# Patient Record
Sex: Male | Born: 1945 | Race: Black or African American | Hispanic: No | Smoking: Never smoker
Health system: Southern US, Community
[De-identification: ages and names within clinical notes are randomized; demographics above are authoritative.]

## PROBLEM LIST (undated history)

## (undated) DIAGNOSIS — C801 Malignant (primary) neoplasm, unspecified: Secondary | ICD-10-CM

## (undated) DIAGNOSIS — G8929 Other chronic pain: Secondary | ICD-10-CM

## (undated) DIAGNOSIS — K219 Gastro-esophageal reflux disease without esophagitis: Secondary | ICD-10-CM

## (undated) DIAGNOSIS — I639 Cerebral infarction, unspecified: Secondary | ICD-10-CM

## (undated) DIAGNOSIS — I10 Essential (primary) hypertension: Secondary | ICD-10-CM

## (undated) DIAGNOSIS — J449 Chronic obstructive pulmonary disease, unspecified: Secondary | ICD-10-CM

## (undated) DIAGNOSIS — I219 Acute myocardial infarction, unspecified: Secondary | ICD-10-CM

## (undated) DIAGNOSIS — M549 Dorsalgia, unspecified: Secondary | ICD-10-CM

## (undated) DIAGNOSIS — M542 Cervicalgia: Secondary | ICD-10-CM

## (undated) HISTORY — PX: PROSTATE CRYOABLATION: SUR358

## (undated) HISTORY — PX: KNEE SURGERY: SHX244

---

## 2004-03-31 ENCOUNTER — Inpatient Hospital Stay (HOSPITAL_COMMUNITY): Admission: RE | Admit: 2004-03-31 | Discharge: 2004-04-03 | Payer: Self-pay | Admitting: Neurosurgery

## 2005-10-05 ENCOUNTER — Encounter: Payer: Self-pay | Admitting: Neurosurgery

## 2005-10-05 ENCOUNTER — Ambulatory Visit (HOSPITAL_COMMUNITY): Admission: RE | Admit: 2005-10-05 | Discharge: 2005-10-05 | Payer: Self-pay | Admitting: Neurosurgery

## 2016-04-03 ENCOUNTER — Emergency Department (HOSPITAL_BASED_OUTPATIENT_CLINIC_OR_DEPARTMENT_OTHER)
Admission: EM | Admit: 2016-04-03 | Discharge: 2016-04-04 | Disposition: A | Discharge status: Home / Self Care | Payer: Medicare Other | Attending: Emergency Medicine | Admitting: Emergency Medicine

## 2016-04-03 ENCOUNTER — Encounter (HOSPITAL_BASED_OUTPATIENT_CLINIC_OR_DEPARTMENT_OTHER): Payer: Self-pay | Admitting: Emergency Medicine

## 2016-04-03 DIAGNOSIS — Z79899 Other long term (current) drug therapy: Secondary | ICD-10-CM | POA: Insufficient documentation

## 2016-04-03 DIAGNOSIS — Z7901 Long term (current) use of anticoagulants: Secondary | ICD-10-CM | POA: Insufficient documentation

## 2016-04-03 DIAGNOSIS — Z8546 Personal history of malignant neoplasm of prostate: Secondary | ICD-10-CM | POA: Diagnosis not present

## 2016-04-03 DIAGNOSIS — J449 Chronic obstructive pulmonary disease, unspecified: Secondary | ICD-10-CM | POA: Diagnosis not present

## 2016-04-03 DIAGNOSIS — Z7982 Long term (current) use of aspirin: Secondary | ICD-10-CM | POA: Insufficient documentation

## 2016-04-03 DIAGNOSIS — H5712 Ocular pain, left eye: Secondary | ICD-10-CM

## 2016-04-03 DIAGNOSIS — H1132 Conjunctival hemorrhage, left eye: Secondary | ICD-10-CM | POA: Insufficient documentation

## 2016-04-03 DIAGNOSIS — I10 Essential (primary) hypertension: Secondary | ICD-10-CM | POA: Insufficient documentation

## 2016-04-03 HISTORY — DX: Cerebral infarction, unspecified: I63.9

## 2016-04-03 HISTORY — DX: Chronic obstructive pulmonary disease, unspecified: J44.9

## 2016-04-03 HISTORY — DX: Other chronic pain: G89.29

## 2016-04-03 HISTORY — DX: Gastro-esophageal reflux disease without esophagitis: K21.9

## 2016-04-03 HISTORY — DX: Cervicalgia: M54.2

## 2016-04-03 HISTORY — DX: Dorsalgia, unspecified: M54.9

## 2016-04-03 HISTORY — DX: Acute myocardial infarction, unspecified: I21.9

## 2016-04-03 HISTORY — DX: Essential (primary) hypertension: I10

## 2016-04-03 HISTORY — DX: Malignant (primary) neoplasm, unspecified: C80.1

## 2016-04-03 MED ORDER — FLUORESCEIN SODIUM 1 MG OP STRP
ORAL_STRIP | OPHTHALMIC | Status: AC
  Filled 2016-04-03: qty 1

## 2016-04-03 MED ORDER — TETRACAINE HCL 0.5 % OP SOLN
2.0000 [drp] | Freq: Once | OPHTHALMIC | Status: AC
  Administered 2016-04-03: 2 [drp] via OPHTHALMIC

## 2016-04-03 MED ORDER — FLUORESCEIN SODIUM 1 MG OP STRP
1.0000 | ORAL_STRIP | Freq: Once | OPHTHALMIC | Status: AC
  Administered 2016-04-03: 1 via OPHTHALMIC

## 2016-04-03 MED ORDER — TETRACAINE HCL 0.5 % OP SOLN
OPHTHALMIC | Status: AC
  Filled 2016-04-03: qty 4

## 2016-04-03 NOTE — ED Triage Notes (Signed)
Patient states that he has a sharp pain to his left eye and then has blood in his scelara. The patient has noted blood to his conjunctiva

## 2016-04-03 NOTE — ED Provider Notes (Signed)
[place] DEPT Provider Note   CSN: [id number] Arrival date & time: 04/03/16  1957  By signing my name below, I, Dora Sims, attest that this documentation has been prepared under the direction and in the presence of non-physician practitioner, Eliezer Mccoy, PA-C. Electronically Signed: Dora Sims, Scribe. 04/03/2016. 10:55 PM.  History   Chief Complaint Chief Complaint  Patient presents with  . Eye Problem    The history is provided by the patient. No language interpreter was used.     HPI Comments: Barry Griffin is a 70 y.o. male who presents to the Emergency Department complaining of a left eye problem that began shortly PTA. He states he was out shopping and suddenly felt something pop behind his left eye. He reports he had severe, throbbing pain at this time for about 15 minutes which has since eased off; he rates his current pain as 3/10. He reports significant dark, blurry vision out of his left eye as well and states this has gradually improved. He states that after his pain eased off, he noticed severe redness of his left eye. He notes he did not sneeze or cough before his left eye pain presented. Pt is diagnosed with vertigo and notes he has experienced some significant dizziness over the last few weeks. He wears bifocals at baseline. He denies headache, fever, chills, CP, SOB, abdominal pain, nausea, vomiting, or any other associated symptoms.  Past Medical History:  Diagnosis Date  . Cancer [facility])    prostate  . Chronic back pain   . Chronic neck pain   . COPD (chronic obstructive pulmonary disease) ([place])   . GERD (gastroesophageal reflux disease)   . Hypertension   . MI (myocardial infarction)   . Stroke [facility])     There are no active problems to display for this patient.   Past Surgical History:  Procedure Laterality Date  . KNEE SURGERY    . PROSTATE CRYOABLATION         Home Medications    Prior to Admission medications   Medication Sig  Start Date End Date Taking? Authorizing Provider  albuterol (PROVENTIL) 2 MG tablet Take 2 mg by mouth 3 (three) times daily.   Yes Historical Provider, MD  allopurinol (ZYLOPRIM) 100 MG tablet Take 100 mg by mouth daily.   Yes Historical Provider, MD  aspirin EC 81 MG tablet Take 81 mg by mouth daily.   Yes Historical Provider, MD  cloNIDine HCl (KAPVAY) 0.1 MG TB12 ER tablet Take by mouth.   Yes Historical Provider, MD  fluticasone-salmeterol (ADVAIR HFA) 230-21 MCG/ACT inhaler Inhale 2 puffs into the lungs 2 (two) times daily.   Yes Historical Provider, MD  furosemide (LASIX) 40 MG tablet Take 40 mg by mouth.   Yes Historical Provider, MD  gabapentin (NEURONTIN) 300 MG capsule Take 300 mg by mouth 3 (three) times daily.   Yes Historical Provider, MD  meclizine (ANTIVERT) 25 MG tablet Take 25 mg by mouth 3 (three) times daily as needed for dizziness.   Yes Historical Provider, MD  metoprolol (LOPRESSOR) 100 MG tablet Take 100 mg by mouth 2 (two) times daily.   Yes Historical Provider, MD  omeprazole (PRILOSEC) 20 MG capsule Take 20 mg by mouth daily.   Yes Historical Provider, MD  traMADol (ULTRAM) 50 MG tablet Take by mouth every 6 (six) hours as needed.   Yes Historical Provider, MD  warfarin (COUMADIN) 4 MG tablet Take 5 mg by mouth daily.   Yes Historical Provider, MD  Family History History reviewed. No pertinent family history.  Social History Social History  Substance Use Topics  . Smoking status: Never Smoker  . Smokeless tobacco: Never Used  . Alcohol use No     Allergies   Lisinopril   Review of Systems Review of Systems  Constitutional: Negative for chills and fever.  Eyes: Positive for pain (left), redness (left) and visual disturbance (dark, blurry vision left eye).  Respiratory: Negative for shortness of breath.   Cardiovascular: Negative for chest pain.  Gastrointestinal: Negative for abdominal pain, nausea and vomiting.  Musculoskeletal: Negative for back  pain.  Skin: Negative for rash and wound.  Neurological: Positive for dizziness (over last several weeks). Negative for headaches.  Psychiatric/Behavioral: The patient is not nervous/anxious.      Physical Exam Updated Vital Signs BP 155/75 (BP Location: Left Arm)   Pulse 61   Temp 98.1 F (36.7 C) (Oral)   Resp 18   Ht 6\' 2"  (1.88 m)   Wt 124.7 kg   SpO2 100%   BMI 35.31 kg/m   Physical Exam  Constitutional: He appears well-developed and well-nourished. No distress.  HENT:  Head: Normocephalic and atraumatic.  Mouth/Throat: Oropharynx is clear and moist. No oropharyngeal exudate.  Eyes: EOM are normal. Pupils are equal, round, and reactive to light. Right eye exhibits no discharge. Left eye exhibits no discharge. Left conjunctiva is injected. Left conjunctiva has a hemorrhage. No scleral icterus.  No uptake on fluorescein stain; Tono-Pen pressures average 16 bilaterally; some conjunctival hemorrhage to the lower lateral left conjunctiva; visual acuity with corrective lenses bilateral 20/30; right 20/50; left 20/40  Neck: Normal range of motion. Neck supple. No thyromegaly present.  Cardiovascular: Normal rate, regular rhythm, normal heart sounds and intact distal pulses.  Exam reveals no gallop and no friction rub.   No murmur heard. Pulmonary/Chest: Effort normal and breath sounds normal. No stridor. No respiratory distress. He has no wheezes. He has no rales.  Abdominal: Soft. Bowel sounds are normal. He exhibits no distension. There is no tenderness. There is no rebound and no guarding.  Musculoskeletal: He exhibits no edema.  Lymphadenopathy:    He has no cervical adenopathy.  Neurological: He is alert. Coordination normal.  Skin: Skin is warm and dry. No rash noted. He is not diaphoretic. No pallor.  Psychiatric: He has a normal mood and affect.  Nursing note and vitals reviewed.        ED Treatments / Results  Labs (all labs ordered are listed, but only abnormal  results are displayed) Labs Reviewed - No data to display  EKG  EKG Interpretation None       Radiology No results found.  Procedures Procedures (including critical care time)  DIAGNOSTIC STUDIES: Oxygen Saturation is 100% on RA, normal by my interpretation.    COORDINATION OF CARE: 11:05 PM Discussed treatment plan with pt at bedside and pt agreed to plan.  Medications Ordered in ED Medications  tetracaine (PONTOCAINE) 0.5 % ophthalmic solution 2 drop (2 drops Left Eye Given 04/03/16 2321)  fluorescein ophthalmic strip 1 strip (1 strip Left Eye Given 04/03/16 2322)     Initial Impression / Assessment and Plan / ED Course  I have reviewed the triage vital signs and the nursing notes.  Pertinent labs & imaging results that were available during my care of the patient were reviewed by me and considered in my medical decision making (see chart for details).  Clinical Course     Patient with some  conjunctival bleeding with pain. This is suspicious due to pain. Pain in mostly resolved in the ED, however. Visual acuity better in the affected eye. I consulted ophthalmology and spoke with Dr. Posey Pronto who would like to see the patient on Monday or Tuesday of this coming week. Dr. Posey Pronto did not suggest any further treatment at this time. Patient understands and agrees with plan. Return precautions discussed. Patient vitals stable throughout ED course and discharged in satisfactory condition. Patient also evaluated by Dr. Winfred Leeds who guided the patient's management and agrees with plan.  I personally performed the services described in this documentation, which was scribed in my presence. The recorded information has been reviewed and is accurate.   Final Clinical Impressions(s) / ED Diagnoses   Final diagnoses:  Left eye pain  Subconjunctival bleed, left    New Prescriptions Discharge Medication List as of 04/04/2016  1:06 AM       Frederica Kuster, PA-C 04/04/16 1620      Orlie Dakin, MD 04/04/16 2334

## 2016-04-03 NOTE — ED Provider Notes (Signed)
Patient had sudden onset pain in his left eye 6:15 PM tonight followed immediately by seeing "stars" for approximately 5 minutes. His vision is now normal. Pain is almost gone without treatment. On exam patient is alert and in no distress left eye with tiny sub conjunctival hemorrhage. Fundi benign. Extraocular muscles intact pupils equal round react to light. No pain on extraocular movement   Orlie Dakin, MD 04/04/16 0000

## 2016-04-04 NOTE — Discharge Instructions (Signed)
Please follow-up with Dr. Posey Pronto by calling his office on Monday morning to be seen Monday or Tuesday. Please return to emergency department if you develop any new or worsening symptoms over the weekend.

## 2018-08-09 ENCOUNTER — Emergency Department (HOSPITAL_BASED_OUTPATIENT_CLINIC_OR_DEPARTMENT_OTHER): Payer: Medicare Other

## 2018-08-09 ENCOUNTER — Emergency Department (HOSPITAL_BASED_OUTPATIENT_CLINIC_OR_DEPARTMENT_OTHER)
Admission: EM | Admit: 2018-08-09 | Discharge: 2018-08-09 | Disposition: A | Discharge status: Home / Self Care | Payer: Medicare Other | Attending: Emergency Medicine | Admitting: Emergency Medicine

## 2018-08-09 ENCOUNTER — Encounter (HOSPITAL_BASED_OUTPATIENT_CLINIC_OR_DEPARTMENT_OTHER): Payer: Self-pay | Admitting: *Deleted

## 2018-08-09 ENCOUNTER — Other Ambulatory Visit: Payer: Self-pay

## 2018-08-09 DIAGNOSIS — R531 Weakness: Secondary | ICD-10-CM | POA: Diagnosis not present

## 2018-08-09 DIAGNOSIS — S0181XA Laceration without foreign body of other part of head, initial encounter: Secondary | ICD-10-CM | POA: Diagnosis not present

## 2018-08-09 DIAGNOSIS — I10 Essential (primary) hypertension: Secondary | ICD-10-CM | POA: Insufficient documentation

## 2018-08-09 DIAGNOSIS — Y92003 Bedroom of unspecified non-institutional (private) residence as the place of occurrence of the external cause: Secondary | ICD-10-CM | POA: Insufficient documentation

## 2018-08-09 DIAGNOSIS — Z7901 Long term (current) use of anticoagulants: Secondary | ICD-10-CM | POA: Insufficient documentation

## 2018-08-09 DIAGNOSIS — W06XXXA Fall from bed, initial encounter: Secondary | ICD-10-CM | POA: Insufficient documentation

## 2018-08-09 DIAGNOSIS — Y9389 Activity, other specified: Secondary | ICD-10-CM | POA: Diagnosis not present

## 2018-08-09 DIAGNOSIS — Z79899 Other long term (current) drug therapy: Secondary | ICD-10-CM | POA: Insufficient documentation

## 2018-08-09 DIAGNOSIS — J449 Chronic obstructive pulmonary disease, unspecified: Secondary | ICD-10-CM | POA: Diagnosis not present

## 2018-08-09 DIAGNOSIS — Y999 Unspecified external cause status: Secondary | ICD-10-CM | POA: Diagnosis not present

## 2018-08-09 DIAGNOSIS — S0990XA Unspecified injury of head, initial encounter: Secondary | ICD-10-CM | POA: Diagnosis not present

## 2018-08-09 DIAGNOSIS — R200 Anesthesia of skin: Secondary | ICD-10-CM | POA: Diagnosis not present

## 2018-08-09 LAB — COMPREHENSIVE METABOLIC PANEL
ALT: 12 U/L (ref 0–44)
AST: 13 U/L — ABNORMAL LOW (ref 15–41)
Albumin: 3.3 g/dL — ABNORMAL LOW (ref 3.5–5.0)
Alkaline Phosphatase: 87 U/L (ref 38–126)
Anion gap: 5 (ref 5–15)
BUN: 11 mg/dL (ref 8–23)
CO2: 28 mmol/L (ref 22–32)
Calcium: 8.2 mg/dL — ABNORMAL LOW (ref 8.9–10.3)
Chloride: 104 mmol/L (ref 98–111)
Creatinine, Ser: 0.87 mg/dL (ref 0.61–1.24)
GFR calc Af Amer: >60 mL/min (ref >60)
GFR calc non Af Amer: >60 mL/min (ref >60)
Glucose, Bld: 156 mg/dL — ABNORMAL HIGH (ref 70–99)
Potassium: 3.4 mmol/L — ABNORMAL LOW (ref 3.5–5.1)
Sodium: 137 mmol/L (ref 135–145)
Total Bilirubin: 0.8 mg/dL (ref 0.3–1.2)
Total Protein: 6.2 g/dL — ABNORMAL LOW (ref 6.5–8.1)

## 2018-08-09 LAB — CBC
HCT: 36.7 % — ABNORMAL LOW (ref 39.0–52.0)
Hemoglobin: 11.4 g/dL — ABNORMAL LOW (ref 13.0–17.0)
MCH: 26.8 pg (ref 26.0–34.0)
MCHC: 31.1 g/dL (ref 30.0–36.0)
MCV: 86.2 fL (ref 80.0–100.0)
Platelets: 162 10*3/uL (ref 150–400)
RBC: 4.26 MIL/uL (ref 4.22–5.81)
RDW: 13.6 % (ref 11.5–15.5)
WBC: 6 10*3/uL (ref 4.0–10.5)
nRBC: 0.3 % — ABNORMAL HIGH (ref 0.0–0.2)

## 2018-08-09 LAB — DIFFERENTIAL
Abs Immature Granulocytes: 0.02 10*3/uL (ref 0.00–0.07)
Basophils Absolute: 0 10*3/uL (ref 0.0–0.1)
Basophils Relative: 0 %
Eosinophils Absolute: 0.2 10*3/uL (ref 0.0–0.5)
Eosinophils Relative: 3 %
Immature Granulocytes: 0 %
Lymphocytes Relative: 32 %
Lymphs Abs: 1.9 10*3/uL (ref 0.7–4.0)
Monocytes Absolute: 0.7 10*3/uL (ref 0.1–1.0)
Monocytes Relative: 11 %
Neutro Abs: 3.3 10*3/uL (ref 1.7–7.7)
Neutrophils Relative %: 54 %

## 2018-08-09 LAB — RAPID URINE DRUG SCREEN, HOSP PERFORMED
Amphetamines: NOT DETECTED
Barbiturates: NOT DETECTED
Benzodiazepines: NOT DETECTED
Cocaine: NOT DETECTED
Opiates: NOT DETECTED
Tetrahydrocannabinol: NOT DETECTED

## 2018-08-09 LAB — URINALYSIS, MICROSCOPIC (REFLEX): WBC, UA: NONE SEEN WBC/hpf (ref 0–5)

## 2018-08-09 LAB — URINALYSIS, ROUTINE W REFLEX MICROSCOPIC
Bilirubin Urine: NEGATIVE
Glucose, UA: NEGATIVE mg/dL
Ketones, ur: NEGATIVE mg/dL
Leukocytes,Ua: NEGATIVE
Nitrite: NEGATIVE
Protein, ur: NEGATIVE mg/dL
Specific Gravity, Urine: 1.01 (ref 1.005–1.030)
pH: 6.5 (ref 5.0–8.0)

## 2018-08-09 LAB — PROTIME-INR
INR: 1.5 — ABNORMAL HIGH (ref 0.8–1.2)
Prothrombin Time: 17.8 seconds — ABNORMAL HIGH (ref 11.4–15.2)

## 2018-08-09 LAB — APTT: aPTT: 33 seconds (ref 24–36)

## 2018-08-09 LAB — TROPONIN I: Troponin I: <0.03 ng/mL (ref <0.03)

## 2018-08-09 NOTE — ED Triage Notes (Signed)
He fell out of the bed and hit his forehead on the night stand. Laceration to his forehead. No LOC. He takes Coumadin.

## 2018-08-09 NOTE — Discharge Instructions (Addendum)
You were seen in the Emergency Department (ED) today for a head injury.  Based on your evaluation, you may have sustained a concussion (or bruise) to your brain.  If you had a CT scan done, it did not show any evidence of serious injury or bleeding.    Symptoms to expect from a concussion include nausea, mild to moderate headache, difficulty concentrating or sleeping, and mild lightheadedness.  These symptoms should improve over the next few days to weeks, but it may take many weeks before you feel back to normal.  Return to the emergency department or follow-up with your primary care doctor if your symptoms are not improving over this time.  Signs of a more serious head injury include vomiting, severe headache, excessive sleepiness or confusion, and weakness or numbness in your face, arms or legs.  Return immediately to the Emergency Department if you experience any of these more concerning symptoms.    Rest, avoid strenuous physical or mental activity, and avoid activities that could potentially result in another head injury until all your symptoms from this head injury are completely resolved for at least 2-3 weeks.  You may take ibuprofen or acetaminophen over the counter according to label instructions for mild headache or scalp soreness.  

## 2018-08-09 NOTE — ED Provider Notes (Signed)
Emergency Department Provider Note   I have reviewed the triage vital signs and the nursing notes.   HISTORY  Chief Complaint Laceration   HPI Barry Griffin is a 73 y.o. male with PMH of chronic back pain, COPD, HTN, and prior CVA with left sided weakness presents to the emergency department for evaluation of head injury and left face numbness.  Patient states that he rolled out of bed this morning and struck his forehead on the bedside table.  States there was a lot of bleeding but no loss of consciousness.  He held pressure and got this to stop.  He states that a family member plain the wound and applied Neosporin along with a Band-Aid.  He states that throughout the day he is felt generalized fatigue and 4 to 5 hours prior to ED presentation developed left face numbness.  Patient has baseline left arm and leg weakness from a prior stroke.  He states that the numbness seems to start in his forehead and go down his left face.  He does not feel numb sensation in his arm or leg.  He does feel that left leg is more weak than normal and feels "heavy." Reports a mild headache but no photophobia. No CP, SOB, or palpitations.    Past Medical History:  Diagnosis Date  . Cancer [facility])    prostate  . Chronic back pain   . Chronic neck pain   . COPD (chronic obstructive pulmonary disease) ([place])   . GERD (gastroesophageal reflux disease)   . Hypertension   . MI (myocardial infarction) ([place])   . Stroke [facility])     There are no active problems to display for this patient.   Past Surgical History:  Procedure Laterality Date  . KNEE SURGERY    . PROSTATE CRYOABLATION      Allergies Lisinopril  No family history on file.  Social History Social History   Tobacco Use  . Smoking status: Never Smoker  . Smokeless tobacco: Never Used  Substance Use Topics  . Alcohol use: No  . Drug use: No    Review of Systems  Constitutional: No fever/chills Eyes: No visual changes. ENT: No sore  throat. Cardiovascular: Denies chest pain. Respiratory: Denies shortness of breath. Gastrointestinal: No abdominal pain. No nausea, no vomiting. No diarrhea. No constipation. Genitourinary: Negative for dysuria. Musculoskeletal: Negative for back pain. Skin: Forehead laceration.  Neurological: Mild HA. Left forehead/face numbness and legs feel heavy, worse on the left.   10-point ROS otherwise negative.  ____________________________________________   PHYSICAL EXAM:  VITAL SIGNS: ED Triage Vitals [08/09/18 2030]  Enc Vitals Group     BP (!) 145/86     Pulse Rate 70     Resp 20     Temp 98.2 F (36.8 C)     Temp Source Oral     SpO2 98 %     Weight 300 lb (136.1 kg)     Height 6\' 2"  (1.88 m)     Pain Score 8   Constitutional: Alert and oriented. Well appearing and in no acute distress. Eyes: Conjunctivae are normal. PERRL.  Head: 2 cm laceration over the right forehead. No hematoma. Wound is well-approximated.  Nose: No congestion/rhinnorhea. Mouth/Throat: Mucous membranes are moist.   Neck: No stridor.  No cervical spine tenderness to palpation. Cardiovascular: Normal rate, regular rhythm. Good peripheral circulation. Grossly normal heart sounds.   Respiratory: Normal respiratory effort.  No retractions. Lungs CTAB. Gastrointestinal: Soft and nontender. No distention.  Musculoskeletal: No lower extremity tenderness nor edema. No gross deformities of extremities. Neurologic: Subjective decreased sensation to the left face without motor deficit. Fluent speech. 4/5 weakness in the LUE and LLE. Normal strength on the right with normal sensation bilaterally.  Skin:  Skin is warm, dry and intact. No rash noted.   ____________________________________________   LABS (all labs ordered are listed, but only abnormal results are displayed)  Labs Reviewed  PROTIME-INR - Abnormal; Notable for the following components:      Result Value   Prothrombin Time 17.8 (*)    INR 1.5 (*)     All other components within normal limits  CBC - Abnormal; Notable for the following components:   Hemoglobin 11.4 (*)    HCT 36.7 (*)    nRBC 0.3 (*)    All other components within normal limits  COMPREHENSIVE METABOLIC PANEL - Abnormal; Notable for the following components:   Potassium 3.4 (*)    Glucose, Bld 156 (*)    Calcium 8.2 (*)    Total Protein 6.2 (*)    Albumin 3.3 (*)    AST 13 (*)    All other components within normal limits  URINALYSIS, ROUTINE W REFLEX MICROSCOPIC - Abnormal; Notable for the following components:   Hgb urine dipstick TRACE (*)    All other components within normal limits  URINALYSIS, MICROSCOPIC (REFLEX) - Abnormal; Notable for the following components:   Bacteria, UA RARE (*)    All other components within normal limits  APTT  DIFFERENTIAL  RAPID URINE DRUG SCREEN, HOSP PERFORMED  TROPONIN I   ____________________________________________  EKG   EKG Interpretation  Date/Time:  Tuesday August 09 2018 20:55:33 EDT Ventricular Rate:  64 PR Interval:    QRS Duration: 98 QT Interval:  432 QTC Calculation: 446 R Axis:   -20 Text Interpretation:  Sinus rhythm Borderline left axis deviation Abnormal R-wave progression, early transition Borderline T wave abnormalities Baseline wander in lead(s) V2 No STEMI.  Confirmed by Nanda Quinton [phone]) on 08/09/2018 9:05:10 PM       ____________________________________________  RADIOLOGY  Dg Chest 2 View  Result Date: 08/09/2018 CLINICAL DATA:  Weakness.  Fall out of bed. EXAM: CHEST - 2 VIEW COMPARISON:  Radiographs 06/08/2018 FINDINGS: Chronic hyperinflation and mild bronchial thickening. Unchanged heart size and mediastinal contours. Minimal streaky opacity at the right lung base. No pneumothorax, pleural effusion, or pulmonary edema. No acute osseous abnormalities are seen. IMPRESSION: 1. Streaky right infrahilar opacities, favor atelectasis. Infection including viral etiologies are also considered. 2.  Chronic hyperinflation and bronchial thickening, imaging findings consistent with COPD. Electronically Signed   By: Keith Rake M.D.   On: 08/09/2018 21:57   Ct Head Wo Contrast  Result Date: 08/09/2018 CLINICAL DATA:  Initial evaluation for acute trauma, fall. EXAM: CT HEAD WITHOUT CONTRAST TECHNIQUE: Contiguous axial images were obtained from the base of the skull through the vertex without intravenous contrast. COMPARISON:  Prior MRI from 09/16/2017. FINDINGS: Brain: Generalized age-related cerebral atrophy with mild chronic small vessel ischemic disease. No acute intracranial hemorrhage. No acute large vessel territory infarct. No mass lesion, midline shift or mass effect. No hydrocephalus. No extra-axial fluid collection. Vascular: No hyperdense vessel. Scattered vascular calcifications noted within the carotid siphons. Skull: No appreciable scalp soft tissue injury.  Calvarium intact. Sinuses/Orbits: Globes and orbital soft tissues demonstrate no acute finding. Mild mucosal thickening within the ethmoidal air cells. Paranasal sinuses are otherwise clear. Trace left mastoid effusion noted, of doubtful significance. Mastoids otherwise  clear. Other: None. IMPRESSION: 1. No acute intracranial abnormality. 2. Mild age-related cerebral atrophy with chronic small vessel ischemic disease. Electronically Signed   By: Jeannine Boga M.D.   On: 08/09/2018 22:02    ____________________________________________   PROCEDURES  Procedure(s) performed:   Procedures  None  ____________________________________________   INITIAL IMPRESSION / ASSESSMENT AND PLAN / ED COURSE  Pertinent labs & imaging results that were available during my care of the patient were reviewed by me and considered in my medical decision making (see chart for details).   Patient presents to the emergency department for what appeared to be 2 separate complaints.  He rolled out of bed this morning and sustained a small  laceration to the right forehead.  The wound was cleaned and dressed at home.  Is very well-appearing, well approximated, non-gaping.  Patient is on Coumadin.  In the time since injury I do not feel that suture is appropriate as this may increase risk of infection.  Advised that the patient will allow to heal by secondary intention.  4-5 hours ago the patient developed left face numbness which apparently new for him. He describes this as starting on the right forehead and then traveling to the left face. Atypical pattern for acute CVA. He also feels increased weakness in his left leg compared to his right but states that they both feel heavy.  Plan for CT imaging of the head to rule out bleed as the patient is on Coumadin.  Will obtain screening labs and EKG.  10:20 PM  Patient labs reviewed and discussed with him.  His INR is subtherapeutic.  Plan to increase his Coumadin over the next 2 days and recheck with his PCP.  I discussed the patient's symptoms including his atypical face numbness which begins on the right side and moves around to the left.  Patient's neurologic exam is unchanged to my reassessment.  His CT imaging shows no acute findings.  We discussed that my suspicion for acute stroke is very low and that bring him into the hospital during the current COVID pandemic may be too risky given his symptom pattern and current exam.  He has baseline weakness on the left and an outpatient neurologist for follow-up.  We discussed calling with his PCP and neurologist tomorrow.  I have added on a troponin and will follow this.  No active chest pain.  Chest x-ray shows infiltrate favoring atelectasis.  The patient is not having any shortness of breath, cough, fever, or other symptoms to suspect developing viral infection or pneumonia.  ____________________________________________  FINAL CLINICAL IMPRESSION(S) / ED DIAGNOSES  Final diagnoses:  Injury of head, initial encounter  Laceration of forehead,  initial encounter  Numbness    Note:  This document was prepared using Dragon voice recognition software and may include unintentional dictation errors.  Nanda Quinton, MD Emergency Medicine    Kylar Leonhardt, [facility], MD 08/09/18 [phone]

## 2020-01-02 ENCOUNTER — Other Ambulatory Visit: Payer: Medicare Other

## 2020-01-02 ENCOUNTER — Other Ambulatory Visit: Payer: Self-pay

## 2020-01-02 DIAGNOSIS — Z20822 Contact with and (suspected) exposure to covid-19: Secondary | ICD-10-CM

## 2020-01-04 LAB — NOVEL CORONAVIRUS, NAA: SARS-CoV-2, NAA: NOT DETECTED

## 2020-08-10 IMAGING — CT CT HEAD WITHOUT CONTRAST
3 series · 14 of 47 positions shown, 16 images · non-contrast
Comparison: Prior MRI from 09/16/2017.

CLINICAL DATA: Initial evaluation for acute trauma, fall.

EXAM:
CT HEAD WITHOUT CONTRAST
TECHNIQUE: Contiguous axial images were obtained from the base of the skull
through the vertex without intravenous contrast.

[Series 3: head 5.0 h30s · axial · 0.49mm/px · z∈[-158,-32]mm · 8 of 30 slices shown, 10 images]
[im 3/30  brain]
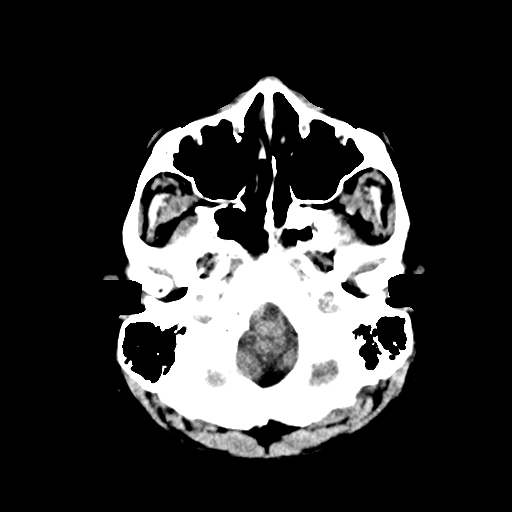
[im 3/30  bone]
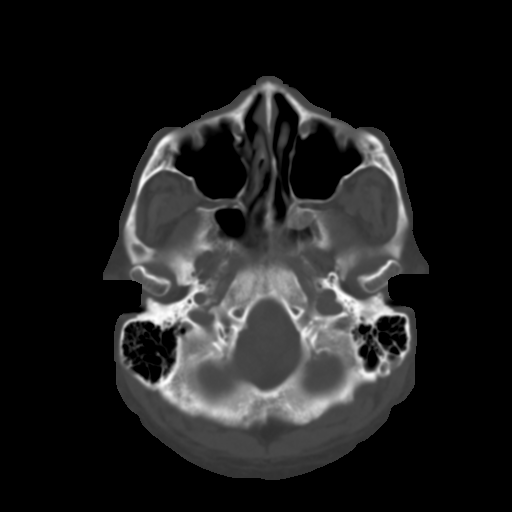
[im 7/30  brain]
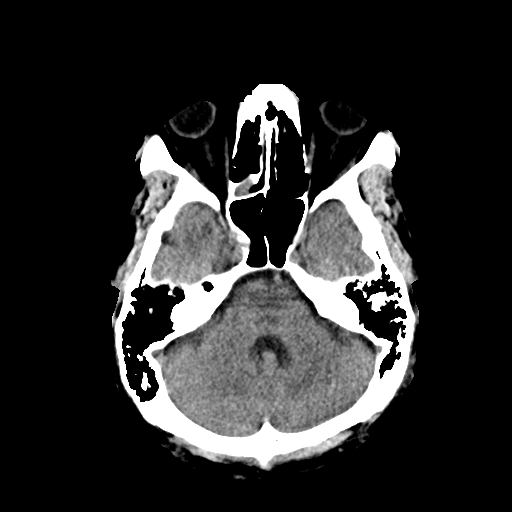
[im 10/30  brain]
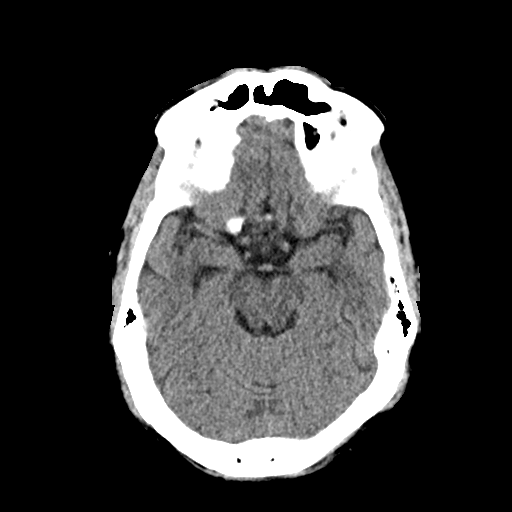
[im 14/30  brain]
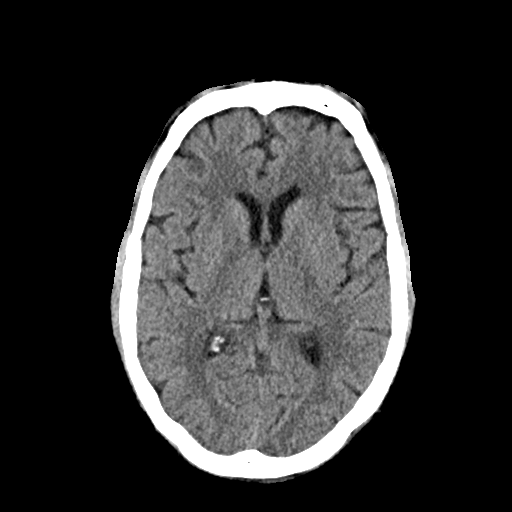
[im 17/30  brain]
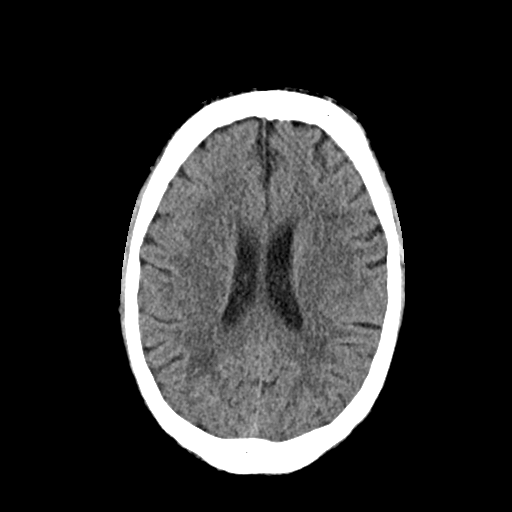
[im 17/30  bone]
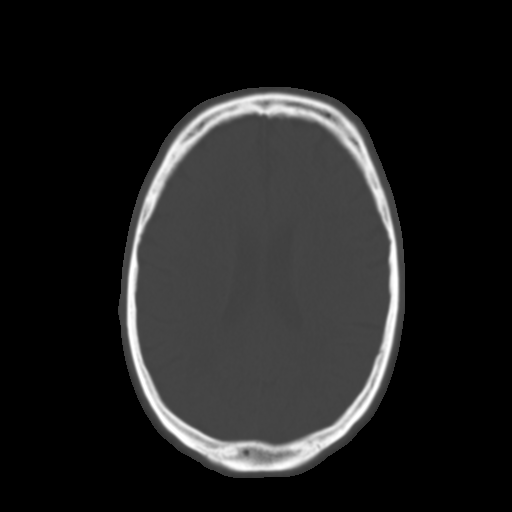
[im 21/30  brain]
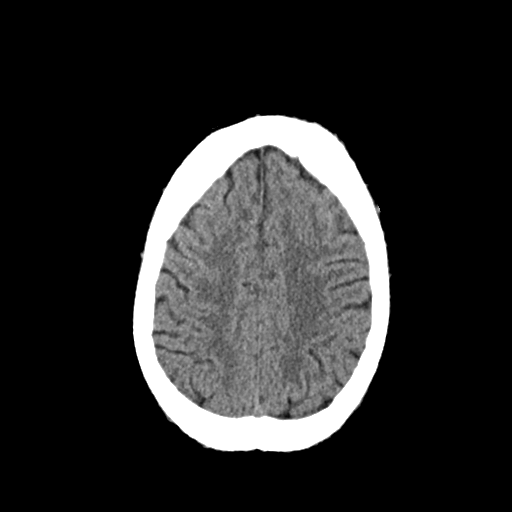
[im 24/30  brain]
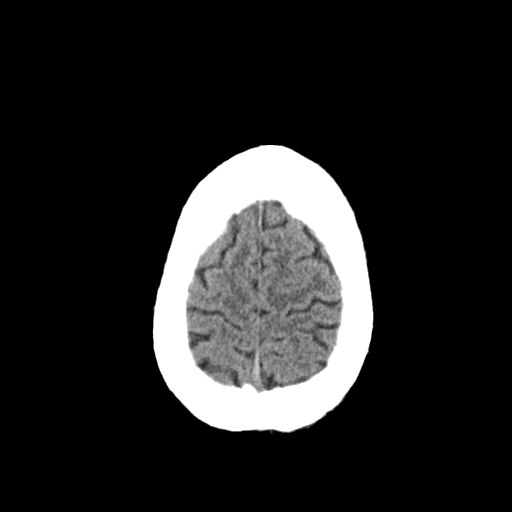
[im 28/30  brain]
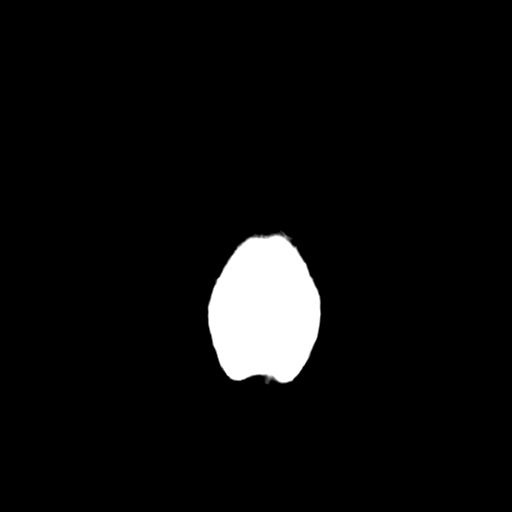

[Series 5: head 3.0 mpr cor · coronal · 0.30mm/px · 3 of 72 slices shown]
[im 24/72  brain]
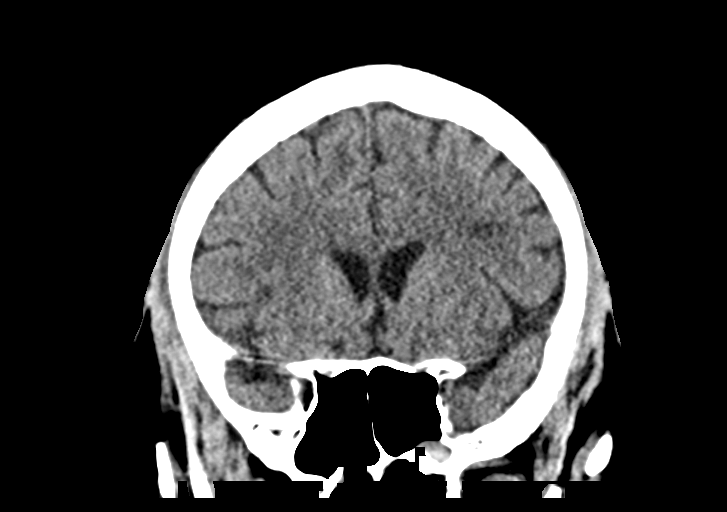
[im 32/72  brain]
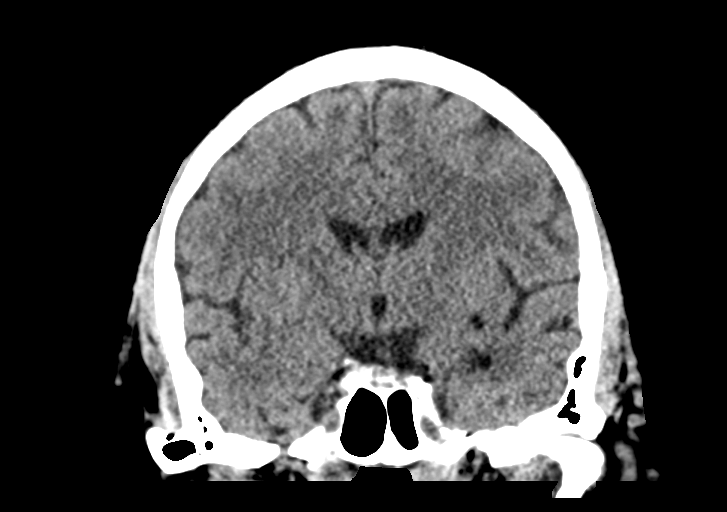
[im 40/72  brain]
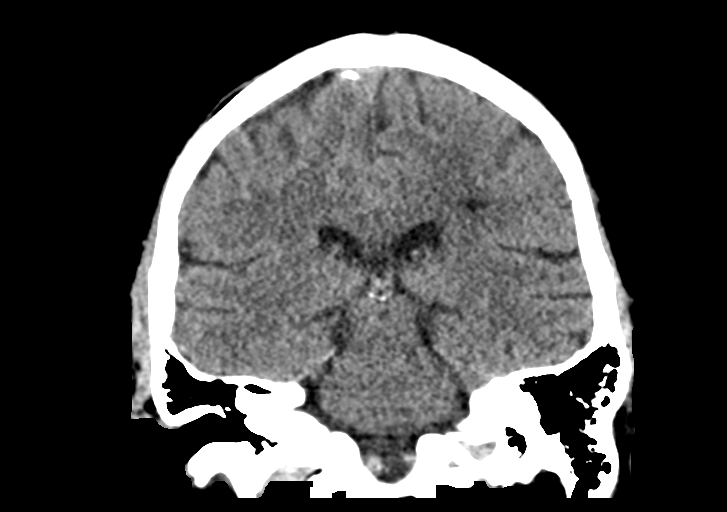

[Series 6: head 3.0 mpr sag · sagittal · 0.29mm/px · 3 of 59 slices shown]
[im 20/59  brain]
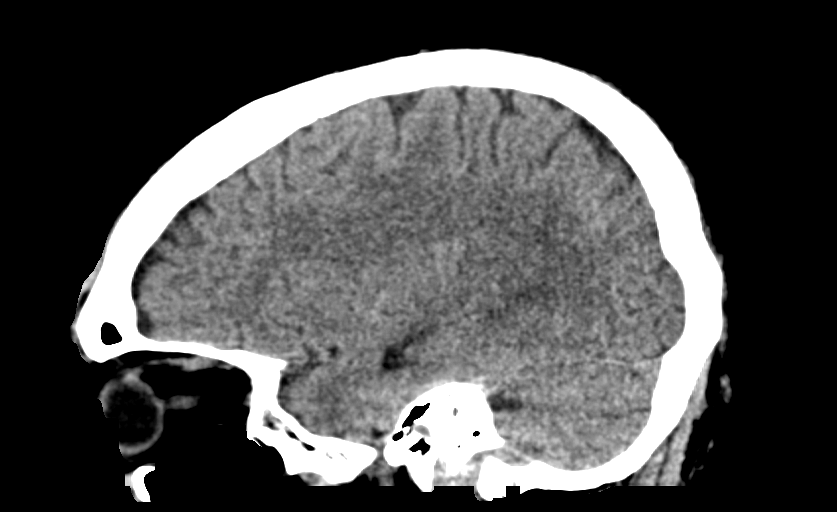
[im 30/59  brain]
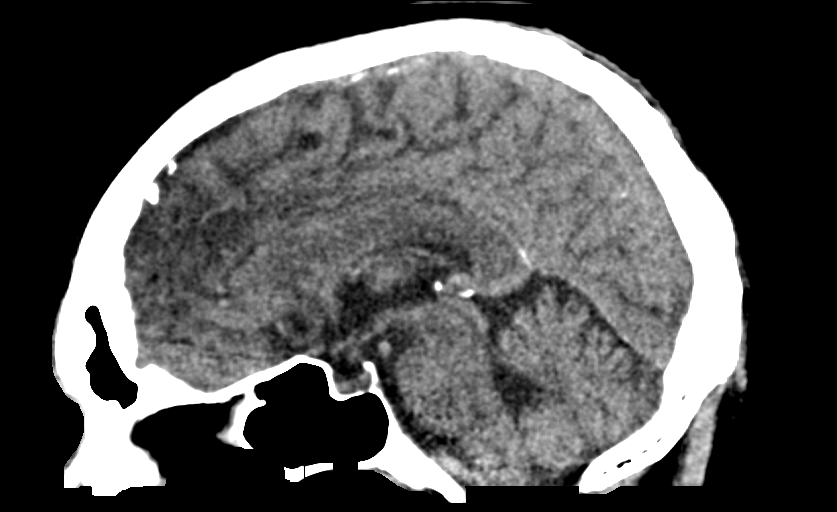
[im 39/59  brain]
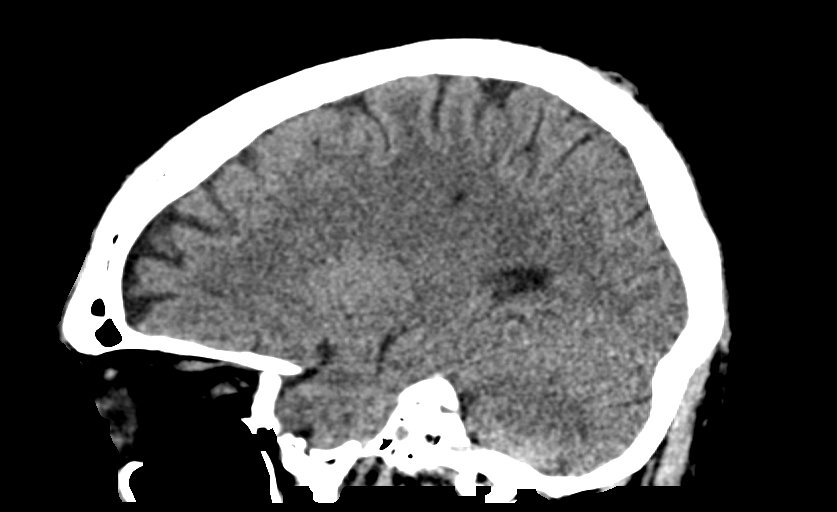

[14 of 47 positions shown; findings below may reference images not displayed]

FINDINGS: Brain: Generalized age-related cerebral atrophy with mild chronic
small vessel ischemic disease. No acute intracranial hemorrhage. No
acute large vessel territory infarct. No mass lesion, midline shift
or mass effect. No hydrocephalus. No extra-axial fluid collection.

Vascular: No hyperdense vessel. Scattered vascular calcifications
noted within the carotid siphons.

Skull: No appreciable scalp soft tissue injury.  Calvarium intact.

Sinuses/Orbits: Globes and orbital soft tissues demonstrate no acute
finding. Mild mucosal thickening within the ethmoidal air cells.
Paranasal sinuses are otherwise clear. Trace left mastoid effusion
noted, of doubtful significance. Mastoids otherwise clear.

Other: None.
IMPRESSION: 1. No acute intracranial abnormality.
2. Mild age-related cerebral atrophy with chronic small vessel
ischemic disease.

## 2022-01-01 ENCOUNTER — Emergency Department (HOSPITAL_BASED_OUTPATIENT_CLINIC_OR_DEPARTMENT_OTHER): Payer: Medicare Other

## 2022-01-01 ENCOUNTER — Other Ambulatory Visit: Payer: Self-pay

## 2022-01-01 ENCOUNTER — Emergency Department (HOSPITAL_BASED_OUTPATIENT_CLINIC_OR_DEPARTMENT_OTHER)
Admission: EM | Admit: 2022-01-01 | Discharge: 2022-01-01 | Disposition: A | Discharge status: Home / Self Care | Payer: Medicare Other | Attending: Emergency Medicine | Admitting: Emergency Medicine

## 2022-01-01 ENCOUNTER — Encounter (HOSPITAL_BASED_OUTPATIENT_CLINIC_OR_DEPARTMENT_OTHER): Payer: Self-pay | Admitting: Emergency Medicine

## 2022-01-01 DIAGNOSIS — Z7982 Long term (current) use of aspirin: Secondary | ICD-10-CM | POA: Insufficient documentation

## 2022-01-01 DIAGNOSIS — N10 Acute tubulo-interstitial nephritis: Secondary | ICD-10-CM | POA: Insufficient documentation

## 2022-01-01 DIAGNOSIS — Z20822 Contact with and (suspected) exposure to covid-19: Secondary | ICD-10-CM | POA: Diagnosis not present

## 2022-01-01 DIAGNOSIS — N12 Tubulo-interstitial nephritis, not specified as acute or chronic: Secondary | ICD-10-CM

## 2022-01-01 DIAGNOSIS — R509 Fever, unspecified: Secondary | ICD-10-CM | POA: Diagnosis present

## 2022-01-01 LAB — CBC WITH DIFFERENTIAL/PLATELET
Abs Immature Granulocytes: 0.1 10*3/uL — ABNORMAL HIGH (ref 0.00–0.07)
Basophils Absolute: 0 10*3/uL (ref 0.0–0.1)
Basophils Relative: 0 %
Eosinophils Absolute: 0 10*3/uL (ref 0.0–0.5)
Eosinophils Relative: 0 %
HCT: 31.6 % — ABNORMAL LOW (ref 39.0–52.0)
Hemoglobin: 10.3 g/dL — ABNORMAL LOW (ref 13.0–17.0)
Immature Granulocytes: 1 %
Lymphocytes Relative: 9 %
Lymphs Abs: 1.6 10*3/uL (ref 0.7–4.0)
MCH: 26.8 pg (ref 26.0–34.0)
MCHC: 32.6 g/dL (ref 30.0–36.0)
MCV: 82.1 fL (ref 80.0–100.0)
Monocytes Absolute: 1.5 10*3/uL — ABNORMAL HIGH (ref 0.1–1.0)
Monocytes Relative: 8 %
Neutro Abs: 15 10*3/uL — ABNORMAL HIGH (ref 1.7–7.7)
Neutrophils Relative %: 82 %
Platelets: 178 10*3/uL (ref 150–400)
RBC: 3.85 MIL/uL — ABNORMAL LOW (ref 4.22–5.81)
RDW: 14.2 % (ref 11.5–15.5)
WBC: 18.2 10*3/uL — ABNORMAL HIGH (ref 4.0–10.5)
nRBC: 0 % (ref 0.0–0.2)

## 2022-01-01 LAB — COMPREHENSIVE METABOLIC PANEL
ALT: 13 U/L (ref 0–44)
AST: 19 U/L (ref 15–41)
Albumin: 3.3 g/dL — ABNORMAL LOW (ref 3.5–5.0)
Alkaline Phosphatase: 69 U/L (ref 38–126)
Anion gap: 8 (ref 5–15)
BUN: 18 mg/dL (ref 8–23)
CO2: 25 mmol/L (ref 22–32)
Calcium: 8.2 mg/dL — ABNORMAL LOW (ref 8.9–10.3)
Chloride: 103 mmol/L (ref 98–111)
Creatinine, Ser: 1.35 mg/dL — ABNORMAL HIGH (ref 0.61–1.24)
GFR, Estimated: 55 mL/min — ABNORMAL LOW (ref >60)
Glucose, Bld: 144 mg/dL — ABNORMAL HIGH (ref 70–99)
Potassium: 3.2 mmol/L — ABNORMAL LOW (ref 3.5–5.1)
Sodium: 136 mmol/L (ref 135–145)
Total Bilirubin: 3.3 mg/dL — ABNORMAL HIGH (ref 0.3–1.2)
Total Protein: 7.4 g/dL (ref 6.5–8.1)

## 2022-01-01 LAB — SARS CORONAVIRUS 2 BY RT PCR: SARS Coronavirus 2 by RT PCR: NEGATIVE

## 2022-01-01 LAB — URINALYSIS, ROUTINE W REFLEX MICROSCOPIC
Bilirubin Urine: NEGATIVE
Glucose, UA: NEGATIVE mg/dL
Ketones, ur: NEGATIVE mg/dL
Nitrite: NEGATIVE
Protein, ur: 30 mg/dL — AB
Specific Gravity, Urine: 1.01 (ref 1.005–1.030)
pH: 5.5 (ref 5.0–8.0)

## 2022-01-01 LAB — URINALYSIS, MICROSCOPIC (REFLEX): WBC, UA: >50 WBC/hpf (ref 0–5)

## 2022-01-01 LAB — PROTIME-INR
INR: 2.7 — ABNORMAL HIGH (ref 0.8–1.2)
Prothrombin Time: 28.5 seconds — ABNORMAL HIGH (ref 11.4–15.2)

## 2022-01-01 LAB — LACTIC ACID, PLASMA: Lactic Acid, Venous: 1.3 mmol/L (ref 0.5–1.9)

## 2022-01-01 MED ORDER — CEPHALEXIN 500 MG PO CAPS: ORAL_CAPSULE | ORAL | 0 refills | Status: DC

## 2022-01-01 MED ORDER — SODIUM CHLORIDE 0.9 % IV SOLN
2.0000 g | Freq: Once | INTRAVENOUS | Status: AC
  Administered 2022-01-01: 2 g via INTRAVENOUS
  Filled 2022-01-01: qty 20

## 2022-01-01 MED ORDER — CEFDINIR 300 MG PO CAPS
300.0000 mg | ORAL_CAPSULE | Freq: Two times a day (BID) | ORAL | 0 refills | Status: AC
Start: 2022-01-01 — End: 2022-01-15

## 2022-01-01 MED ORDER — ACETAMINOPHEN 500 MG PO TABS
1000.0000 mg | ORAL_TABLET | Freq: Once | ORAL | Status: AC
Start: 2022-01-01 — End: 2022-01-01
  Administered 2022-01-01: 1000 mg via ORAL
  Filled 2022-01-01: qty 2

## 2022-01-01 MED ORDER — CEFDINIR 300 MG PO CAPS: 300.0000 mg | ORAL_CAPSULE | Freq: Two times a day (BID) | ORAL | 0 refills | Status: DC

## 2022-01-01 NOTE — Discharge Instructions (Addendum)
Take tylenol 2 pills 4 times a day.  Drink plenty of fluids.  Return for worsening shortness of breath, headache, confusion. Follow up with your family doctor.     

## 2022-01-01 NOTE — ED Triage Notes (Signed)
Pt with cough, body aches and fever x 2 days.

## 2022-01-01 NOTE — ED Notes (Signed)
Patient transported to CT 

## 2022-01-01 NOTE — ED Provider Notes (Signed)
[facility] DEPART[facility] Provider Note   CSN: [id number] Arrival date & time: 01/01/22  1949     History  Chief Complaint  Patient presents with   URI    Barry Griffin is a 76 y.o. male.  76 yo M with a chief complaints of cough congestion fevers chills myalgias.  Going on for about 24 hours now.  No known sick contacts but then later states that a lot of family members that have not been in close contact have been sick.  No nausea or vomiting.  Eating and drinking but less.   URI      Home Medications Prior to Admission medications   Medication Sig Start Date End Date Taking? Authorizing Provider  albuterol (PROVENTIL) 2 MG tablet Take 2 mg by mouth 3 (three) times daily.    [provider]  allopurinol (ZYLOPRIM) 100 MG tablet Take 100 mg by mouth daily.    [provider]  Celedonio Miyamoto 62.5-25 MCG/INH AEPB INL 1 PUFF PO D 08/02/18   [provider]  aspirin EC 81 MG tablet Take 81 mg by mouth daily.    [provider]  cefdinir (OMNICEF) 300 MG capsule Take 1 capsule (300 mg total) by mouth 2 (two) times daily for 14 days. 01/01/22 01/15/22  Deno Etienne, DO  cloNIDine HCl (KAPVAY) 0.1 MG TB12 ER tablet Take by mouth.    [provider]  fluticasone-salmeterol (ADVAIR HFA) 230-21 MCG/ACT inhaler Inhale 2 puffs into the lungs 2 (two) times daily.    [provider]  furosemide (LASIX) 40 MG tablet Take 40 mg by mouth.    [provider]  gabapentin (NEURONTIN) 300 MG capsule Take 300 mg by mouth 3 (three) times daily.    [provider]  meclizine (ANTIVERT) 25 MG tablet Take 25 mg by mouth 3 (three) times daily as needed for dizziness.    [provider]  metoprolol (LOPRESSOR) 100 MG tablet Take 100 mg by mouth 2 (two) times daily.    [provider]  omeprazole (PRILOSEC) 20 MG capsule Take 20 mg by mouth daily.    [provider]  traMADol (ULTRAM) 50 MG  tablet Take by mouth every 6 (six) hours as needed.    [provider]  warfarin (COUMADIN) 4 MG tablet Take 5 mg by mouth daily.    [provider]      Allergies    Lisinopril    Review of Systems   Review of Systems  Physical Exam Updated Vital Signs BP (!) 93/58   Pulse 80   Temp 100 F (37.8 C) (Oral)   Resp 19   Ht '6\' 2"'$  (1.88 m)   Wt 132.7 kg   SpO2 94%   BMI 37.55 kg/m  Physical Exam Vitals and nursing note reviewed.  Constitutional:      Appearance: He is well-developed.  HENT:     Head: Normocephalic and atraumatic.  Eyes:     Pupils: Pupils are equal, round, and reactive to light.  Neck:     Vascular: No JVD.  Cardiovascular:     Rate and Rhythm: Normal rate and regular rhythm.     Heart sounds: No murmur heard.    No friction rub. No gallop.  Pulmonary:     Effort: No respiratory distress.     Breath sounds: No wheezing.  Abdominal:     General: There is no distension.     Tenderness: There is no abdominal tenderness.  There is no guarding or rebound.  Musculoskeletal:        General: Normal range of motion.     Cervical back: Normal range of motion and neck supple.     Right lower leg: Edema present.     Left lower leg: Edema present.     Comments: 1-2+ pitting edema to bilateral lower extremities up to the knees.  Chronic per patient and family.  Skin:    Coloration: Skin is not pale.     Findings: No rash.  Neurological:     Mental Status: He is alert and oriented to person, place, and time.  Psychiatric:        Behavior: Behavior normal.     ED Results / Procedures / Treatments   Labs (all labs ordered are listed, but only abnormal results are displayed) Labs Reviewed  COMPREHENSIVE METABOLIC PANEL - Abnormal; Notable for the following components:      Result Value   Potassium 3.2 (*)    Glucose, Bld 144 (*)    Creatinine, Ser 1.35 (*)    Calcium 8.2 (*)    Albumin 3.3 (*)    Total Bilirubin 3.3 (*)    GFR,  Estimated 55 (*)    All other components within normal limits  CBC WITH DIFFERENTIAL/PLATELET - Abnormal; Notable for the following components:   WBC 18.2 (*)    RBC 3.85 (*)    Hemoglobin 10.3 (*)    HCT 31.6 (*)    Neutro Abs 15.0 (*)    Monocytes Absolute 1.5 (*)    Abs Immature Granulocytes 0.10 (*)    All other components within normal limits  PROTIME-INR - Abnormal; Notable for the following components:   Prothrombin Time 28.5 (*)    INR 2.7 (*)    All other components within normal limits  URINALYSIS, ROUTINE W REFLEX MICROSCOPIC - Abnormal; Notable for the following components:   APPearance TURBID (*)    Hgb urine dipstick MODERATE (*)    Protein, ur 30 (*)    Leukocytes,Ua LARGE (*)    All other components within normal limits  URINALYSIS, MICROSCOPIC (REFLEX) - Abnormal; Notable for the following components:   Bacteria, UA MANY (*)    Non Squamous Epithelial PRESENT (*)    All other components within normal limits  SARS CORONAVIRUS 2 BY RT PCR  CULTURE, BLOOD (ROUTINE X 2)  CULTURE, BLOOD (ROUTINE X 2)  LACTIC ACID, PLASMA  LACTIC ACID, PLASMA    EKG None  Radiology CT Renal Stone Study  Result Date: 01/01/2022 CLINICAL DATA:  Cough with body aches and fever. EXAM: CT ABDOMEN AND PELVIS WITHOUT CONTRAST TECHNIQUE: Multidetector CT imaging of the abdomen and pelvis was performed following the standard protocol without IV contrast. RADIATION DOSE REDUCTION: This exam was performed according to the departmental dose-optimization program which includes automated exposure control, adjustment of the mA and/or kV according to patient size and/or use of iterative reconstruction technique. COMPARISON:  June 07, 2015 FINDINGS: Lower chest: Mild atelectasis is seen within the posterior aspect of the left lung base. A small left pleural effusion is noted. Hepatobiliary: A stable 2.7 cm x 1.6 cm ill-defined focus of parenchymal low attenuation is seen within the left lobe of the  liver. No gallstones, gallbladder wall thickening, or biliary dilatation. Pancreas: Unremarkable. No pancreatic ductal dilatation or surrounding inflammatory changes. Spleen: Normal in size without focal abnormality. Adrenals/Urinary Tract: Adrenal glands are unremarkable. A stable 9.4 cm x 6.1 cm right-sided pelvic renal cyst  is seen. A 1.9 cm diameter hyperdense cyst is seen along the posterolateral aspect of the mid left kidney. This is seen as a simple cyst on the prior study. There is bilateral mild right perinephric and moderate severity left perinephric inflammatory fat stranding. A 5 mm renal calculus is seen within the mid left ureter. The urinary bladder is poorly distended and subsequently limited in evaluation. Stomach/Bowel: Stomach is within normal limits. Appendix appears normal. No evidence of bowel wall thickening, distention, or inflammatory changes. Vascular/Lymphatic: Aortic atherosclerosis. No enlarged abdominal or pelvic lymph nodes. Reproductive: Prostate is unremarkable. Other: No abdominal wall hernia or abnormality. No abdominopelvic ascites. Musculoskeletal: Multilevel degenerative changes seen throughout the lumbar spine. IMPRESSION: 1. 5 mm renal calculus within the mid left ureter. 2. Stable 9.4 cm x 6.1 cm right-sided pelvic renal cyst. No additional follow-up or imaging is recommended. This recommendation follows ACR consensus guidelines: Management of the Incidental Renal Mass on CT: A White Paper of the ACR Incidental Findings Committee. J Am Coll Radiol 2018;15:264-273. 3. 1.9 cm hemorrhagic left renal cyst. 4. Small left pleural effusion. 5. Stable hepatic cyst versus hemangioma. 6. Aortic atherosclerosis. Aortic Atherosclerosis (ICD10-I70.0). Electronically Signed   By: Virgina Norfolk M.D.   On: 01/01/2022 23:07   DG Chest Portable 1 View  Result Date: 01/01/2022 CLINICAL DATA:  Cough, body aches, and fever for 2 days. EXAM: PORTABLE CHEST 1 VIEW COMPARISON:  12/14/2020.  FINDINGS: Heart is enlarged the mediastinal contour is within normal limits. Lung volumes are low with atelectasis at the left lung base. No effusion or pneumothorax. No acute osseous abnormality. IMPRESSION: 1. Low lung volumes with mild atelectasis at the left lung base. 2. Cardiomegaly. Electronically Signed   By: Brett Fairy M.D.   On: 01/01/2022 20:18    Procedures Procedures    Medications Ordered in ED Medications  acetaminophen (TYLENOL) tablet 1,000 mg (1,000 mg Oral Given 01/01/22 2117)  cefTRIAXone (ROCEPHIN) 2 g in sodium chloride 0.9 % 100 mL IVPB (2 g Intravenous New Bag/Given 01/01/22 2306)    ED Course/ Medical Decision Making/ A&P                           Medical Decision Making Amount and/or Complexity of Data Reviewed Labs: ordered. Radiology: ordered.  Risk OTC drugs. Prescription drug management.   76 yo M with a chief complaints of cough congestion fevers chills myalgias going on for about 48 hours.  He is well-appearing and nontoxic.  Has a temp of 1025 on arrival.  Blood work and infectious work-up ordered through the triage process.  Patient without any significant change in his renal function lactate was normal Leukocytosis of 18,000.  Chest x-ray independently interpreted by me without obvious focal infiltrate.  Patient's urine does look infected, large leukocyte esterase too numerous to count whites too numerous to count bacteria.  He does endorse some urinary symptoms and tells me he is having some right flank pain.  Given a dose of Rocephin.  CT stone study without kidney stone.  We will have him on oral antibiotics.  Follow-up with his family doctor in the office.  11:45 PM:  I have discussed the diagnosis/risks/treatment options with the patient and family.  Evaluation and diagnostic testing in the emergency department does not suggest an emergent condition requiring admission or immediate intervention beyond what has been performed at this time.  They  will follow up with  PCP. We also discussed returning to  the ED immediately if new or worsening sx occur. We discussed the sx which are most concerning (e.g., sudden worsening pain, fever, inability to tolerate by mouth) that necessitate immediate return. Medications administered to the patient during their visit and any new prescriptions provided to the patient are listed below.  Medications given during this visit Medications  acetaminophen (TYLENOL) tablet 1,000 mg (1,000 mg Oral Given 01/01/22 2117)  cefTRIAXone (ROCEPHIN) 2 g in sodium chloride 0.9 % 100 mL IVPB (2 g Intravenous New Bag/Given 01/01/22 2306)     The patient appears reasonably screen and/or stabilized for discharge and I doubt any other medical condition or other [facility] requiring further screening, evaluation, or treatment in the ED at this time prior to discharge.          Final Clinical Impression(s) / ED Diagnoses Final diagnoses:  Pyelonephritis    Rx / DC Orders ED Discharge Orders          Ordered    cephALEXin (KEFLEX) 500 MG capsule  Status:  Discontinued        01/01/22 2315    cefdinir (OMNICEF) 300 MG capsule  2 times daily,   Status:  Discontinued        01/01/22 2315    cefdinir (OMNICEF) 300 MG capsule  2 times daily        01/01/22 2336              Deno Etienne, DO 01/01/22 2345

## 2022-01-06 LAB — CULTURE, BLOOD (ROUTINE X 2)
Culture: NO GROWTH
Special Requests: ADEQUATE
